# Patient Record
Sex: Female | Born: 1970 | Hispanic: Yes | Marital: Single | Smoking: Never smoker
Health system: Southern US, Community
[De-identification: ages and names within clinical notes are randomized; demographics above are authoritative.]

## PROBLEM LIST (undated history)

## (undated) DIAGNOSIS — E079 Disorder of thyroid, unspecified: Secondary | ICD-10-CM

## (undated) HISTORY — PX: TUBAL LIGATION: SHX77

---

## 2019-04-30 ENCOUNTER — Other Ambulatory Visit: Payer: Self-pay | Admitting: Internal Medicine

## 2019-04-30 DIAGNOSIS — Z20822 Contact with and (suspected) exposure to covid-19: Secondary | ICD-10-CM

## 2019-05-03 LAB — NOVEL CORONAVIRUS, NAA: SARS-CoV-2, NAA: DETECTED — AB

## 2019-05-10 ENCOUNTER — Other Ambulatory Visit: Payer: Self-pay

## 2019-05-10 ENCOUNTER — Encounter: Payer: Self-pay | Admitting: Emergency Medicine

## 2019-05-10 ENCOUNTER — Emergency Department: Payer: HRSA Program

## 2019-05-10 ENCOUNTER — Emergency Department
Admission: EM | Admit: 2019-05-10 | Discharge: 2019-05-10 | Disposition: A | Discharge status: Home / Self Care | Payer: HRSA Program | Attending: Emergency Medicine | Admitting: Emergency Medicine

## 2019-05-10 DIAGNOSIS — K5730 Diverticulosis of large intestine without perforation or abscess without bleeding: Secondary | ICD-10-CM | POA: Diagnosis not present

## 2019-05-10 DIAGNOSIS — U071 COVID-19: Secondary | ICD-10-CM | POA: Diagnosis not present

## 2019-05-10 DIAGNOSIS — R109 Unspecified abdominal pain: Secondary | ICD-10-CM | POA: Diagnosis present

## 2019-05-10 DIAGNOSIS — R1011 Right upper quadrant pain: Secondary | ICD-10-CM

## 2019-05-10 DIAGNOSIS — R05 Cough: Secondary | ICD-10-CM | POA: Diagnosis not present

## 2019-05-10 HISTORY — DX: Disorder of thyroid, unspecified: E07.9

## 2019-05-10 LAB — URINALYSIS, COMPLETE (UACMP) WITH MICROSCOPIC
Bilirubin Urine: NEGATIVE
Glucose, UA: NEGATIVE mg/dL
Hgb urine dipstick: NEGATIVE
Ketones, ur: NEGATIVE mg/dL
Nitrite: NEGATIVE
Protein, ur: 30 mg/dL — AB
Specific Gravity, Urine: 1.026 (ref 1.005–1.030)
pH: 6 (ref 5.0–8.0)

## 2019-05-10 LAB — CBC
HCT: 44.8 % (ref 36.0–46.0)
Hemoglobin: 14.7 g/dL (ref 12.0–15.0)
MCH: 30.4 pg (ref 26.0–34.0)
MCHC: 32.8 g/dL (ref 30.0–36.0)
MCV: 92.8 fL (ref 80.0–100.0)
Platelets: 415 10*3/uL — ABNORMAL HIGH (ref 150–400)
RBC: 4.83 MIL/uL (ref 3.87–5.11)
RDW: 13 % (ref 11.5–15.5)
WBC: 4.3 10*3/uL (ref 4.0–10.5)
nRBC: 0 % (ref 0.0–0.2)

## 2019-05-10 LAB — COMPREHENSIVE METABOLIC PANEL
ALT: 19 U/L (ref 0–44)
AST: 32 U/L (ref 15–41)
Albumin: 3.5 g/dL (ref 3.5–5.0)
Alkaline Phosphatase: 79 U/L (ref 38–126)
Anion gap: 9 (ref 5–15)
BUN: 18 mg/dL (ref 6–20)
CO2: 22 mmol/L (ref 22–32)
Calcium: 8.3 mg/dL — ABNORMAL LOW (ref 8.9–10.3)
Chloride: 107 mmol/L (ref 98–111)
Creatinine, Ser: 0.55 mg/dL (ref 0.44–1.00)
GFR calc Af Amer: >60 mL/min (ref >60)
GFR calc non Af Amer: >60 mL/min (ref >60)
Glucose, Bld: 102 mg/dL — ABNORMAL HIGH (ref 70–99)
Potassium: 4 mmol/L (ref 3.5–5.1)
Sodium: 138 mmol/L (ref 135–145)
Total Bilirubin: 0.6 mg/dL (ref 0.3–1.2)
Total Protein: 7.9 g/dL (ref 6.5–8.1)

## 2019-05-10 LAB — POCT PREGNANCY, URINE: Preg Test, Ur: NEGATIVE

## 2019-05-10 LAB — LIPASE, BLOOD: Lipase: 30 U/L (ref 11–51)

## 2019-05-10 MED ORDER — IOHEXOL 300 MG/ML  SOLN
100.0000 mL | Freq: Once | INTRAMUSCULAR | Status: AC | PRN
  Administered 2019-05-10: 100 mL via INTRAVENOUS
  Filled 2019-05-10: qty 100

## 2019-05-10 MED ORDER — ONDANSETRON 4 MG PO TBDP: 4.0000 mg | ORAL_TABLET | Freq: Three times a day (TID) | ORAL | 0 refills | Status: AC | PRN

## 2019-05-10 MED ORDER — SODIUM CHLORIDE 0.9% FLUSH: 3.0000 mL | Freq: Once | INTRAVENOUS | Status: DC

## 2019-05-10 MED ORDER — AZITHROMYCIN 500 MG PO TABS: 500.0000 mg | ORAL_TABLET | Freq: Every day | ORAL | 0 refills | Status: AC

## 2019-05-10 MED ORDER — MORPHINE SULFATE (PF) 2 MG/ML IV SOLN
2.0000 mg | Freq: Once | INTRAVENOUS | Status: AC
  Administered 2019-05-10: 13:00:00 2 mg via INTRAVENOUS
  Filled 2019-05-10: qty 1

## 2019-05-10 MED ORDER — ONDANSETRON HCL 4 MG/2ML IJ SOLN
4.0000 mg | Freq: Once | INTRAMUSCULAR | Status: AC
  Administered 2019-05-10: 13:00:00 4 mg via INTRAVENOUS
  Filled 2019-05-10: qty 2

## 2019-05-10 NOTE — ED Triage Notes (Signed)
Upper abdominal pain and unable to eat.  No voimiting.  A little cough.  Started 2 weeks ago.

## 2019-05-10 NOTE — ED Notes (Signed)
AAOx3.  Skin warm and dry.  NAD 

## 2019-05-10 NOTE — ED Provider Notes (Signed)
[facility] : [facility] Emergency Department Provider Note _____   First MD Initiated Contact with Patient 05/10/19 1214     (approximate)  I have reviewed the triage vital signs and the nursing notes.   HISTORY  Chief Complaint Abdominal Pain and Cough  HPI Hannah Crane is a 48 y.o. female presents to the emergency department with a 2-week history of intermittent upper abdominal discomfort with associated nausea and inability to tolerate p.o. patient states that symptoms been occurring for the past 2 weeks.  Patient denies any diarrhea.  Patient denies any urinary symptoms.  Patient states that she has had no recent illness however chart review revealed that the patient was tested positive for COVID-19 04/30/2019.       Past Medical History:  Diagnosis Date  . Thyroid disease     There are no active problems to display for this patient.   Past Surgical History:  Procedure Laterality Date  . CESAREAN SECTION      Prior to Admission medications   Medication Sig Start Date End Date Taking? Authorizing Provider  azithromycin (ZITHROMAX) 500 MG tablet Take 1 tablet (500 mg total) by mouth daily for 3 days. Take 1 tablet daily for 3 days. 05/10/19 05/13/19  Gregor Hams, MD  ondansetron (ZOFRAN ODT) 4 MG disintegrating tablet Take 1 tablet (4 mg total) by mouth every 8 (eight) hours as needed. 05/10/19   Gregor Hams, MD    Allergies Patient has no known allergies.  No family history on file.  Social History Social History   Tobacco Use  . Smoking status: Never Smoker  . Smokeless tobacco: Never Used  Substance Use Topics  . Alcohol use: Never    Frequency: Never  . Drug use: Not on file    Review of Systems Constitutional: No fever/chills Eyes: No visual changes. ENT: No sore throat. Cardiovascular: Denies chest pain. Respiratory: Positive for cough Gastrointestinal: Positive for abdominal pain and nausea no vomiting.  No diarrhea.   No constipation. Genitourinary: Negative for dysuria. Musculoskeletal: Negative for neck pain.  Negative for back pain. Integumentary: Negative for rash. Neurological: Negative for headaches, focal weakness or numbness.  ____________________________________________   PHYSICAL EXAM:  VITAL SIGNS: ED Triage Vitals [05/10/19 1144]  Enc Vitals Group     BP 113/71     Pulse Rate 65     Resp 16     Temp 99.3 F (37.4 C)     Temp Source Oral     SpO2 95 %     Weight      Height 1.372 m (4\' 6" )     Head Circumference      Peak Flow      Pain Score 9     Pain Loc      Pain Edu?      Excl. in [place]?     Constitutional: Alert and oriented.  Apparent discomfort Eyes: Conjunctivae are normal.  Mouth/Throat: Mucous membranes are moist. Oropharynx non-erythematous. Neck: No stridor.  Cardiovascular: Normal rate, regular rhythm. Good peripheral circulation. Grossly normal heart sounds. Respiratory: Normal respiratory effort.  No retractions. No audible wheezing. Gastrointestinal: Generalized tenderness to palpation.. No distention.  Musculoskeletal: No lower extremity tenderness nor edema. No gross deformities of extremities. Neurologic:  Normal speech and language. No gross focal neurologic deficits are appreciated.  Skin:  Skin is warm, dry and intact. No rash noted. Psychiatric: Mood and affect are normal. Speech and behavior are normal.  ____________________________________________   LABS (all  labs ordered are listed, but only abnormal results are displayed)  Labs Reviewed  COMPREHENSIVE METABOLIC PANEL - Abnormal; Notable for the following components:      Result Value   Glucose, Bld 102 (*)    Calcium 8.3 (*)    All other components within normal limits  CBC - Abnormal; Notable for the following components:   Platelets 415 (*)    All other components within normal limits  URINALYSIS, COMPLETE (UACMP) WITH MICROSCOPIC - Abnormal; Notable for the following components:    Color, Urine AMBER (*)    APPearance CLOUDY (*)    Protein, ur 30 (*)    Leukocytes,Ua SMALL (*)    Bacteria, UA RARE (*)    All other components within normal limits  LIPASE, BLOOD  POC URINE PREG, ED  POCT PREGNANCY, URINE   ____________________________________________  EKG  ED ECG REPORT I, [place] Waldine Zenz, the attending physician, personally viewed and interpreted this ECG.   Date: 05/10/2019  EKG Time: 12:00 PM  Rate: 61  Rhythm: Normal sinus rhythm  Axis: Normal  Intervals: Normal  ST&T Change: None  ____________________________________________  RADIOLOGY I, [place] Loreen Bankson, personally viewed and evaluated these images (plain radiographs) as part of my medical decision making, as well as reviewing the written report by the radiologist.  ED MD interpretation:    Official radiology report(s): Ct Abdomen Pelvis W Contrast  Result Date: 05/10/2019 CLINICAL DATA:  Upper abdominal pain. Anorexia. Cough. SARS-CoV-2 positive. EXAM: CT ABDOMEN AND PELVIS WITH CONTRAST TECHNIQUE: Multidetector CT imaging of the abdomen and pelvis was performed using the standard protocol following bolus administration of intravenous contrast. CONTRAST:  100mL OMNIPAQUE IOHEXOL 300 MG/ML  SOLN COMPARISON:  None. FINDINGS: Lower chest: Patchy nodular consolidation and ground-glass opacity throughout both lung bases. Hepatobiliary: Normal liver size. No liver mass. Normal gallbladder with no radiopaque cholelithiasis. No biliary ductal dilatation. Pancreas: Normal, with no mass or duct dilation. Spleen: Normal size. No mass. Adrenals/Urinary Tract: Normal adrenals. Simple partially exophytic 1.2 cm renal cyst in lower right kidney. Otherwise normal kidneys, with no hydronephrosis. Normal bladder. Stomach/Bowel: Normal non-distended stomach. Normal caliber small bowel with no small bowel wall thickening. Normal appendix. Moderate sigmoid diverticulosis, with no large bowel wall thickening or significant  pericolonic fat stranding. Vascular/Lymphatic: Normal caliber abdominal aorta. Patent portal, splenic, hepatic and renal veins. No pathologically enlarged lymph nodes in the abdomen or pelvis. Reproductive: Anteverted uterus with multiple simple appearing nabothian cysts in the cervix. Simple 3.1 cm right adnexal cyst (series 2/image 63). No left adnexal lesions. Other: No pneumoperitoneum, ascites or focal fluid collection. Musculoskeletal: No aggressive appearing focal osseous lesions. Moderate lower lumbar spondylosis. IMPRESSION: 1. Patchy nodular consolidation and ground-glass opacity throughout both lung bases, compatible with known COVID-19 pneumonia. 2. No acute abnormality in the abdomen or pelvis. Moderate sigmoid diverticulosis, with no evidence of acute diverticulitis. 3. Simple 3.1 cm right adnexal cyst. No follow-up recommended. This recommendation follows ACR consensus guidelines: White Paper of the ACR Incidental Findings Committee II on Adnexal Findings. J Am Coll Radiol [id number]. Electronically Signed   By: Delbert PhenixJason A Poff M.D.   On: 05/10/2019 14:34   [place] Abdomen Limited Ruq  Result Date: 05/10/2019 CLINICAL DATA:  Abdominal pain. EXAM: ULTRASOUND ABDOMEN LIMITED RIGHT UPPER QUADRANT COMPARISON:  No recent prior. FINDINGS: Gallbladder: No gallstones or wall thickening visualized. No sonographic Murphy sign noted by sonographer. Common bile duct: Diameter: 3.1 mm Liver: No focal lesion identified. Within normal limits in parenchymal echogenicity. Portal vein is  patent on color Doppler imaging with normal direction of blood flow towards the liver. Other: None. IMPRESSION: No acute or focal abnormality.  No gallstones or biliary distention. Electronically Signed   By: Maisie Fushomas  Register   On: 05/10/2019 13:41      Procedures   ____________________________________________   INITIAL IMPRESSION / MDM / ASSESSMENT AND PLAN / ED COURSE  As part of my medical decision making, I reviewed  the following data within the electronic MEDICAL RECORD NUMBER   48 year old female presented with above-stated history and physical exam secondary to generalized abdominal pain.  Considered possibly of cholelithiasis cholecystitis diverticulitis appendicitis as such ultrasound was performed revealed no acute abnormality subsequently CT which was likewise negative with the exception of diverticulosis without evidence of diverticulitis.  As such I suspect the patient symptoms to be secondary to COVID-19 infection.  Stressed the importance to the patient regarding home quarantine.  Also informed the patient of warning signs that would warrant immediate return to the emergency department.     ____________________________________________  FINAL CLINICAL IMPRESSION(S) / ED DIAGNOSES  Final diagnoses:  RUQ pain  COVID-19 virus infection  Diverticulosis of colon     MEDICATIONS GIVEN DURING THIS VISIT:  Medications  morphine 2 MG/ML injection 2 mg (2 mg Intravenous Given 05/10/19 1250)  ondansetron (ZOFRAN) injection 4 mg (4 mg Intravenous Given 05/10/19 1250)  iohexol (OMNIPAQUE) 300 MG/ML solution 100 mL (100 mLs Intravenous Contrast Given 05/10/19 1413)     ED Discharge Orders         Ordered    ondansetron (ZOFRAN ODT) 4 MG disintegrating tablet  Every 8 hours PRN     05/10/19 1449    azithromycin (ZITHROMAX) 500 MG tablet  Daily     05/10/19 1449          *Please note:  Hannah Crane was evaluated in Emergency Department on 05/11/2019 for the symptoms described in the history of present illness. She was evaluated in the context of the global COVID-19 pandemic, which necessitated consideration that the patient might be at risk for infection with the SARS-CoV-2 virus that causes COVID-19. Institutional protocols and algorithms that pertain to the evaluation of patients at risk for COVID-19 are in a state of rapid change based on information released by regulatory bodies including the CDC  and federal and state organizations. These policies and algorithms were followed during the patient's care in the ED.  Some ED evaluations and interventions may be delayed as a result of limited staffing during the pandemic.*  Note:  This document was prepared using Dragon voice recognition software and may include unintentional dictation errors.   Darci CurrentBrown, [place] N, MD 05/11/19 1051

## 2019-05-10 NOTE — ED Triage Notes (Signed)
sakys upper mid abdominal pain

## 2020-02-24 ENCOUNTER — Ambulatory Visit: Payer: Self-pay

## 2020-02-24 DIAGNOSIS — Z23 Encounter for immunization: Secondary | ICD-10-CM

## 2020-03-16 ENCOUNTER — Ambulatory Visit: Payer: Self-pay | Attending: Internal Medicine

## 2020-03-16 DIAGNOSIS — Z23 Encounter for immunization: Secondary | ICD-10-CM

## 2020-04-15 IMAGING — CT CT ABDOMEN AND PELVIS WITH CONTRAST
2 of 5 series · 16 of 46 positions shown, 18 images · IV contrast (APPLIED)
Comparison: None.

CLINICAL DATA: Upper abdominal pain. Anorexia. Cough. [id number]-[id number]
positive.

EXAM:
CT ABDOMEN AND PELVIS WITH CONTRAST
TECHNIQUE: Multidetector CT imaging of the abdomen and pelvis was performed
using the standard protocol following bolus administration of
intravenous contrast.
CONTRAST:  100mL OMNIPAQUE IOHEXOL 300 MG/ML  SOLN

[Series 2: routine abd/pel with · axial · 0.74mm/px · z∈[-486,-56]mm · 13 of 96 slices shown, 15 images]
[im 5/96  soft-tissue]
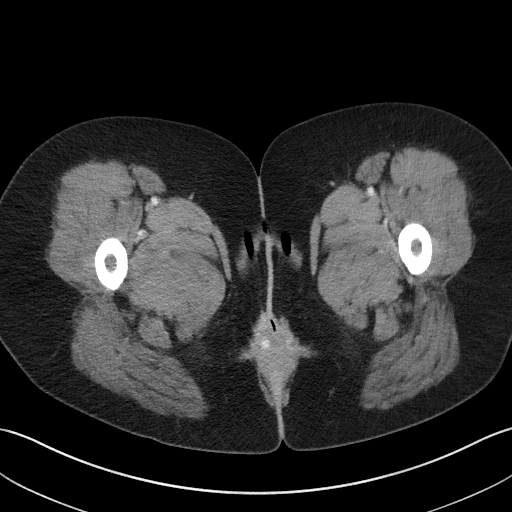
[im 5/96  bone]
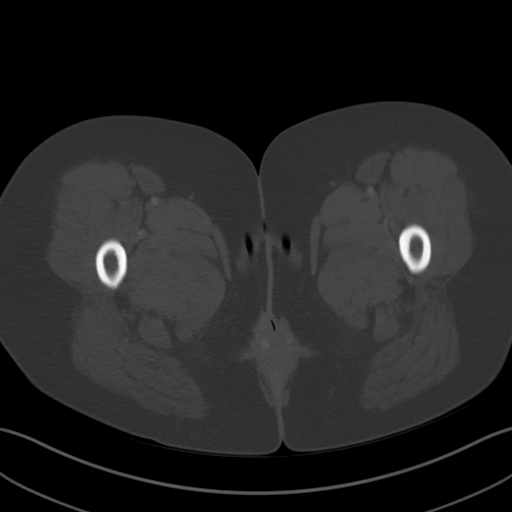
[im 15/96  soft-tissue]
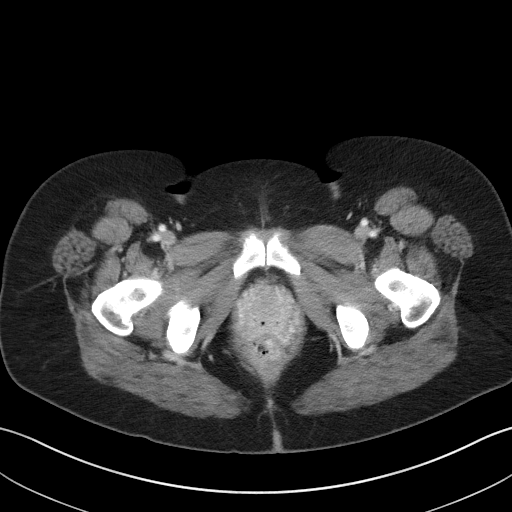
[im 20/96  soft-tissue]
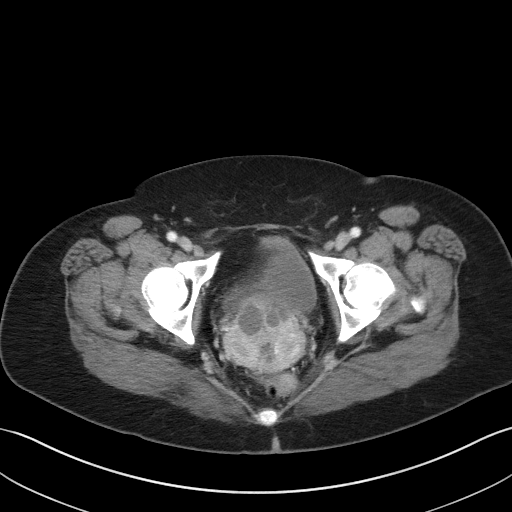
[im 29/96  soft-tissue]
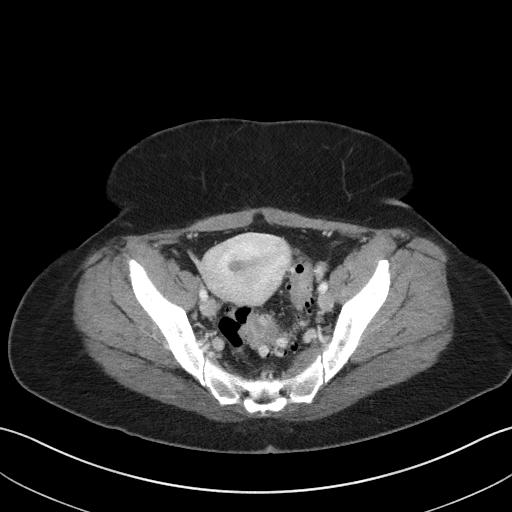
[im 34/96  soft-tissue]
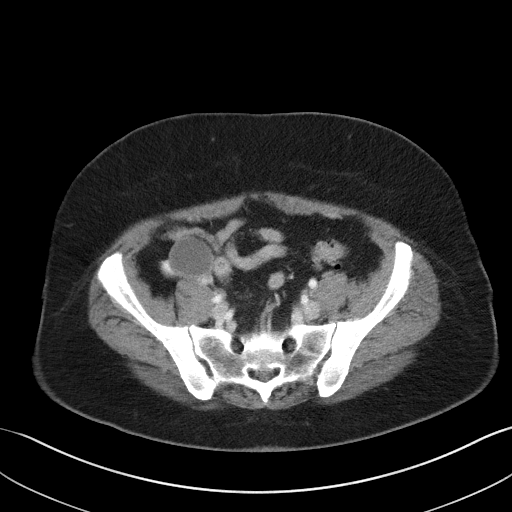
[im 43/96  soft-tissue]
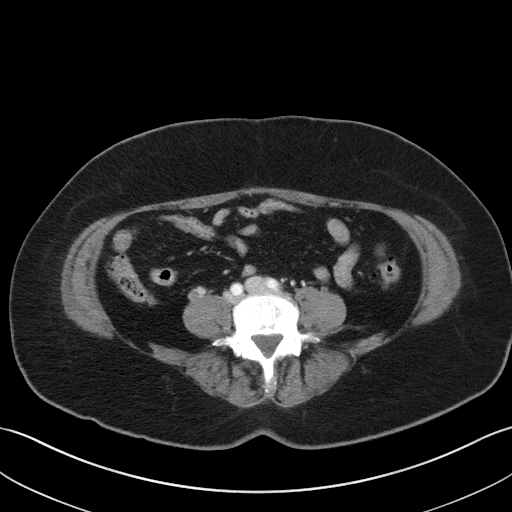
[im 48/96  soft-tissue]
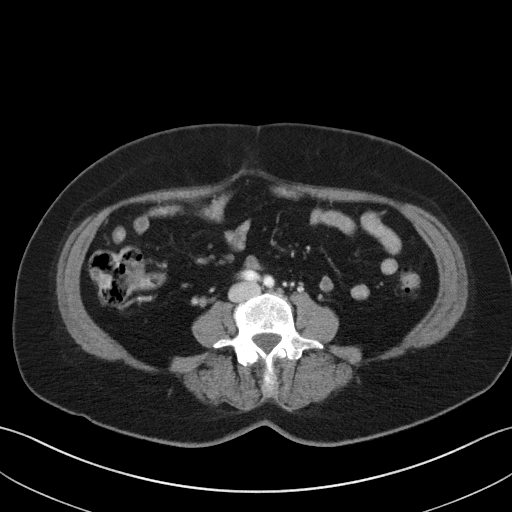
[im 53/96  soft-tissue]
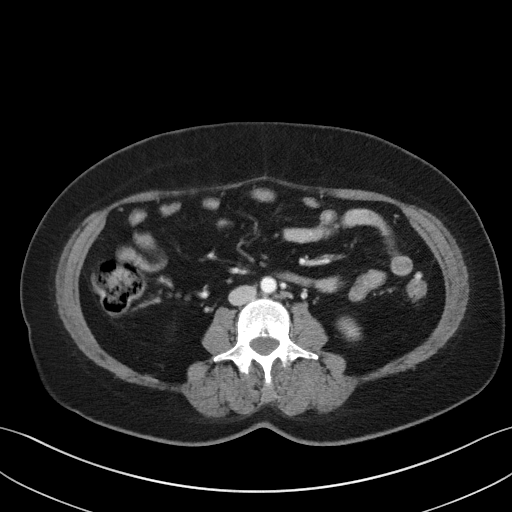
[im 62/96  soft-tissue]
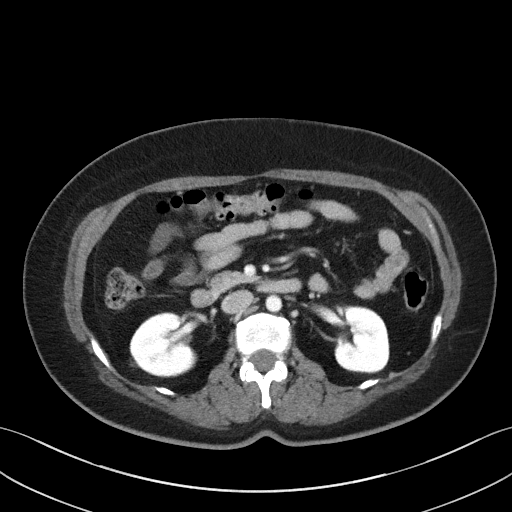
[im 62/96  bone]
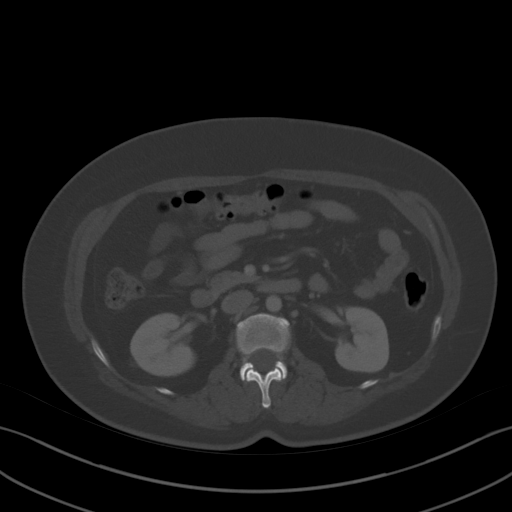
[im 67/96  soft-tissue]
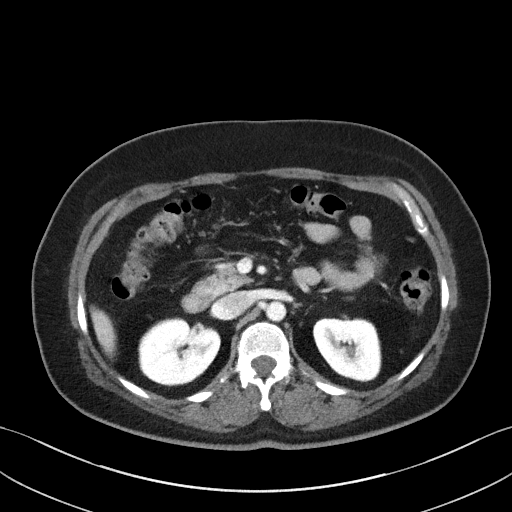
[im 77/96  soft-tissue]
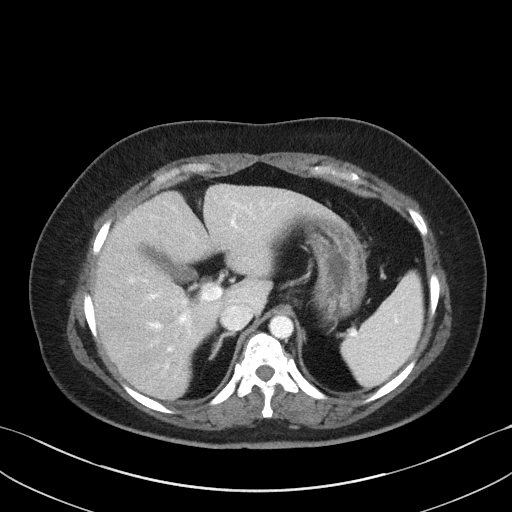
[im 81/96  soft-tissue]
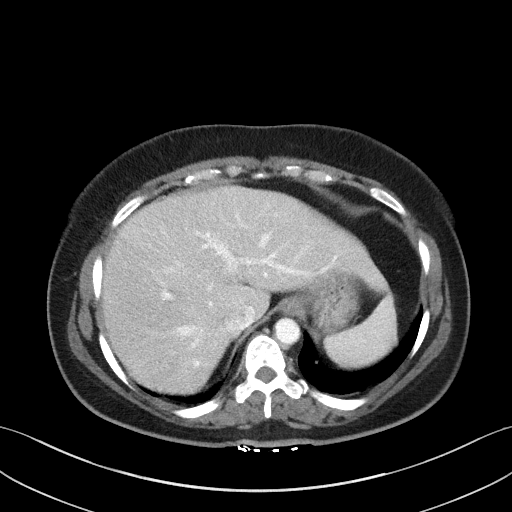
[im 91/96  soft-tissue]
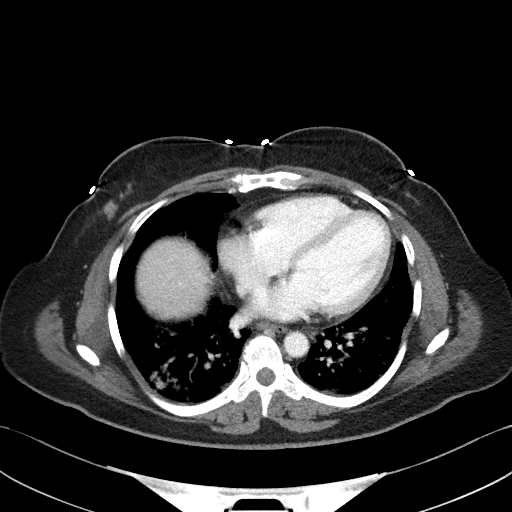

[Series 6: coronal st · coronal · 0.82mm/px · 3 of 86 slices shown]
[im 29/86  soft-tissue]
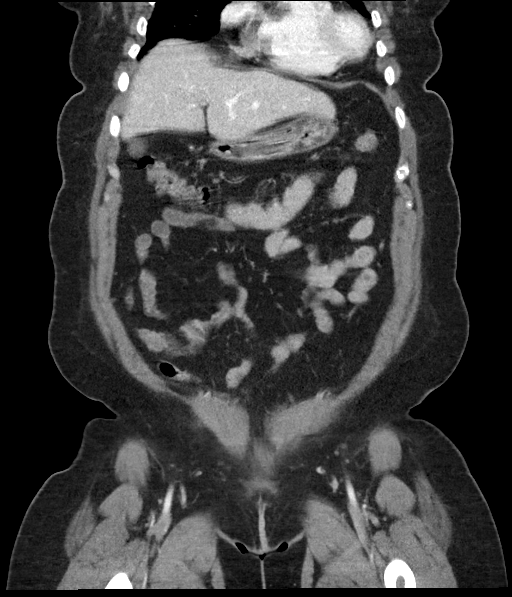
[im 38/86  soft-tissue]
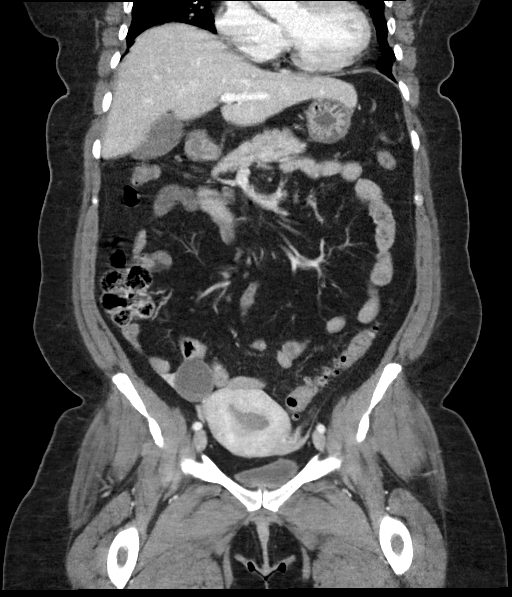
[im 48/86  soft-tissue]
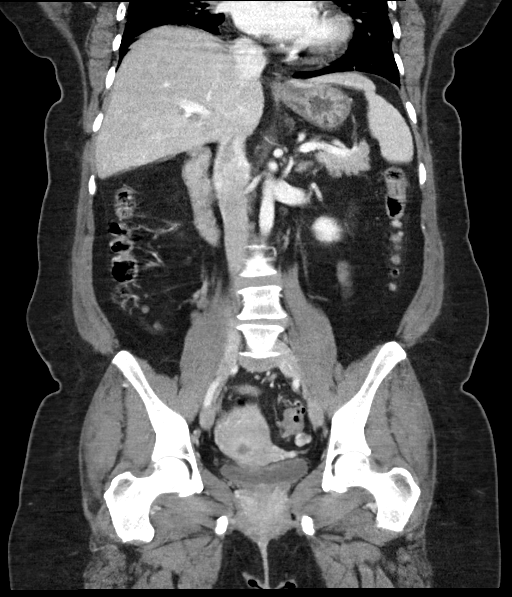

[16 of 46 positions shown; findings below may reference images not displayed]

FINDINGS: Lower chest: Patchy nodular consolidation and ground-glass opacity
throughout both lung bases.

Hepatobiliary: Normal liver size. No liver mass. Normal gallbladder
with no radiopaque cholelithiasis. No biliary ductal dilatation.

Pancreas: Normal, with no mass or duct dilation.

Spleen: Normal size. No mass.

Adrenals/Urinary Tract: Normal adrenals. Simple partially exophytic
1.2 cm renal cyst in lower right kidney. Otherwise normal kidneys,
with no hydronephrosis. Normal bladder.

Stomach/Bowel: Normal non-distended stomach. Normal caliber small
bowel with no small bowel wall thickening. Normal appendix. Moderate
sigmoid diverticulosis, with no large bowel wall thickening or
significant pericolonic fat stranding.

Vascular/Lymphatic: Normal caliber abdominal aorta. Patent portal,
splenic, hepatic and renal veins. No pathologically enlarged lymph
nodes in the abdomen or pelvis.

Reproductive: Anteverted uterus with multiple simple appearing
nabothian cysts in the cervix. Simple 3.1 cm right adnexal cyst
(series 2/image 63). No left adnexal lesions.

Other: No pneumoperitoneum, ascites or focal fluid collection.

Musculoskeletal: No aggressive appearing focal osseous lesions.
Moderate lower lumbar spondylosis.
IMPRESSION: 1. Patchy nodular consolidation and ground-glass opacity throughout
both lung bases, compatible with known 510PR-J1 pneumonia.
2. No acute abnormality in the abdomen or pelvis. Moderate sigmoid
diverticulosis, with no evidence of acute diverticulitis.
3. Simple 3.1 cm right adnexal cyst. No follow-up recommended. This
recommendation follows ACR consensus guidelines: White Paper of the
ACR Incidental Findings Committee II on Adnexal Findings. [HOSPITAL] [DATE].

## 2022-08-23 ENCOUNTER — Encounter: Payer: Self-pay | Admitting: Pediatric Orthopedic Surgery

## 2022-09-16 ENCOUNTER — Other Ambulatory Visit: Payer: Self-pay | Admitting: *Deleted

## 2022-09-16 ENCOUNTER — Inpatient Hospital Stay
Admission: RE | Admit: 2022-09-16 | Discharge: 2022-09-16 | Disposition: A | Discharge status: Home / Self Care | Payer: Self-pay | Source: Ambulatory Visit | Attending: *Deleted | Admitting: *Deleted

## 2022-09-16 DIAGNOSIS — Z1231 Encounter for screening mammogram for malignant neoplasm of breast: Secondary | ICD-10-CM

## 2022-10-18 ENCOUNTER — Other Ambulatory Visit: Payer: Self-pay

## 2022-10-18 DIAGNOSIS — Z1231 Encounter for screening mammogram for malignant neoplasm of breast: Secondary | ICD-10-CM

## 2022-11-12 ENCOUNTER — Ambulatory Visit
Admission: RE | Admit: 2022-11-12 | Discharge: 2022-11-12 | Disposition: A | Discharge status: Home / Self Care | Payer: Self-pay | Source: Ambulatory Visit | Attending: Obstetrics and Gynecology | Admitting: Obstetrics and Gynecology

## 2022-11-12 ENCOUNTER — Ambulatory Visit: Payer: Self-pay | Attending: Hematology and Oncology | Admitting: Hematology and Oncology

## 2022-11-12 VITALS — BP 126/71 | Wt 166.5 lb

## 2022-11-12 DIAGNOSIS — Z1231 Encounter for screening mammogram for malignant neoplasm of breast: Secondary | ICD-10-CM

## 2022-11-12 NOTE — Progress Notes (Signed)
Hannah Crane is a 52 y.o. female who presents to [facility] clinic today with no complaints.    Pap Smear: Pap not smear completed today. Last Pap smear was 2023 at [facility] clinic and was normal. Per patient has no history of an abnormal Pap smear. Last Pap smear result is not available in Epic.   Physical exam: Breasts Breasts symmetrical. No skin abnormalities bilateral breasts. No nipple retraction bilateral breasts. No nipple discharge bilateral breasts. No lymphadenopathy. No lumps palpated bilateral breasts.        Pelvic/Bimanual Pap is not indicated today    Smoking History: Patient has never smoked and was not referred to quit line.    Patient Navigation: Patient education provided. Access to services provided for patient through [place] interpreter provided. No transportation provided   Colorectal Cancer Screening: Per patient has never had colonoscopy completed No complaints today. FIT test per Princella Ion   Breast and Cervical Cancer Risk Assessment: Patient does not have family history of breast cancer, known genetic mutations, or radiation treatment to the chest before age 74. Patient does not have history of cervical dysplasia, immunocompromised, or DES exposure in-utero.  Risk Scores as of 11/12/2022     [place]           5-year 1.23 %   Lifetime 10.64 %   This patient is [place] but has no documented birth country, so the [place] used data from [facility] patients to calculate their risk score. Document a birth country in the Demographics activity for a more accurate score.         Last calculated by Demetrius Revel, LPN on 01/11/8031 at  1:22 AM        A: [facility] exam without pap smear No complaints with benign exam.  P: [facility]. Appointment scheduled 11/12/22.  [place] Scrape A, NP 11/12/2022 9:01 AM

## 2022-11-12 NOTE — Patient Instructions (Signed)
[facility] about breast self awareness and gave educational materials to take home. Patient did not need a Pap smear today due to last Pap smear was in 2023 per patient. She will be due in 2028. Let her know [facility] will cover Pap smears every 5 years unless has a history of abnormal Pap smears. Referred patient to the [facility] for diagnostic mammogram. Appointment scheduled for 11/12/22. Patient aware of appointment and will be there. Let patient know will follow up with her within the next couple weeks with results. [place] verbalized understanding.  Melodye Ped, NP 9:03 AM

## 2023-11-19 ENCOUNTER — Other Ambulatory Visit: Payer: Self-pay

## 2023-11-19 ENCOUNTER — Emergency Department
Admission: EM | Admit: 2023-11-19 | Discharge: 2023-11-19 | Disposition: A | Discharge status: Home / Self Care | Payer: Self-pay | Attending: Emergency Medicine | Admitting: Emergency Medicine

## 2023-11-19 DIAGNOSIS — B029 Zoster without complications: Secondary | ICD-10-CM | POA: Insufficient documentation

## 2023-11-19 MED ORDER — OXYCODONE HCL 5 MG PO TABS: 5.0000 mg | ORAL_TABLET | Freq: Three times a day (TID) | ORAL | 0 refills | Status: DC | PRN

## 2023-11-19 MED ORDER — KETOROLAC TROMETHAMINE 60 MG/2ML IM SOLN
30.0000 mg | Freq: Once | INTRAMUSCULAR | Status: AC
  Administered 2023-11-19: 30 mg via INTRAMUSCULAR
  Filled 2023-11-19: qty 2

## 2023-11-19 MED ORDER — OXYCODONE HCL 5 MG PO TABS: 5.0000 mg | ORAL_TABLET | Freq: Three times a day (TID) | ORAL | 0 refills | Status: AC | PRN

## 2023-11-19 NOTE — ED Triage Notes (Signed)
Pt was dx with shingles on her left knee on 1/27, pt reports rash is burning and leg is hurting. Pt has not been taking medication as prescribed.

## 2023-11-19 NOTE — ED Provider Notes (Signed)
[facility] Provider Note    Event Date/Time   First MD Initiated Contact with Patient 11/19/23 0247     (approximate)   History   Rash   HPI Hannah Crane is a 53 y.o. female presenting today for ration pain.  Patient states she had a recent diagnosis of shingles on her left lower leg.  She has had nerve pain at the site since initiation.  She states the rash was improving but pain symptoms are still there.  She was prescribed valacyclovir for 7 days but states only taking the medication for 3 days.  Denies rash or pain symptoms elsewhere.     Physical Exam   Triage Vital Signs: ED Triage Vitals  Encounter Vitals Group     BP 11/19/23 0244 123/76     Systolic BP Percentile --      Diastolic BP Percentile --      Pulse Rate 11/19/23 0244 71     Resp 11/19/23 0244 18     Temp 11/19/23 0244 97.8 F (36.6 C)     Temp Source 11/19/23 0244 Oral     SpO2 11/19/23 0244 99 %     Weight 11/19/23 0244 176 lb (79.8 kg)     Height 11/19/23 0244 4\' 7"  (1.397 m)     Head Circumference --      Peak Flow --      Pain Score 11/19/23 0243 10     Pain Loc --      Pain Education --      Exclude from Growth Chart --     Most recent vital signs: Vitals:   11/19/23 0244  BP: 123/76  Pulse: 71  Resp: 18  Temp: 97.8 F (36.6 C)  SpO2: 99%   I have reviewed the vital signs. General:  Awake, alert, no acute distress. Head:  Normocephalic, Atraumatic. EENT:  PERRL, EOMI, Oral mucosa pink and moist, Neck is supple. Cardiovascular: Regular rate, 2+ distal pulses. Respiratory:  Normal respiratory effort, symmetrical expansion, no distress.   Extremities:  Moving all four extremities through full ROM without pain.   Neuro:  Alert and oriented.  Interacting appropriately.   Skin: Appropriately healing shingles rash in the L2 distribution of the left lower extremity.  No pustules or other fluid drainage Psych: Appropriate affect.    ED Results /  Procedures / Treatments   Labs (all labs ordered are listed, but only abnormal results are displayed) Labs Reviewed - No data to display   EKG    RADIOLOGY    PROCEDURES:  Critical Care performed: No  Procedures   MEDICATIONS ORDERED IN ED: Medications  ketorolac (TORADOL) injection 30 mg (has no administration in time range)     IMPRESSION / MDM / ASSESSMENT AND PLAN / ED COURSE  I reviewed the triage vital signs and the nursing notes.                              Differential diagnosis includes, but is not limited to, shingles, neuropathic pain  Patient's presentation is most consistent with acute, uncomplicated illness.  Patient is a 53 year old female presenting today for neuropathic pain related to recent shingles diagnosis.  Rash does appear to be improving at this time in the L2 distribution on her left lower extremity with no active pustules.  Pain symptoms do seem most consistent with neuropathic pain related to shingles.  Will give Toradol shot  here and discharged with oxycodone as needed.  Recommended use of ibuprofen and Tylenol as well.  She also did not fully complete her valacyclovir treatment and recommended her complete that at this time.  Told to follow-up with PCP and given strict return precautions.     FINAL CLINICAL IMPRESSION(S) / ED DIAGNOSES   Final diagnoses:  Herpes zoster without complication     Rx / DC Orders   ED Discharge Orders          Ordered    oxyCODONE (ROXICODONE) 5 MG immediate release tablet  Every 8 hours PRN        11/19/23 0313             Note:  This document was prepared using Dragon voice recognition software and may include unintentional dictation errors.   Janith Lima, MD 11/19/23 [phone]

## 2023-11-19 NOTE — Discharge Instructions (Signed)
You can take ibuprofen and Tylenol with pain symptoms.  I have sent stronger medication to your pharmacy to only take as needed for severe pain.  You need to complete the treatment with the medication they gave you with a valacyclovir.  Symptoms should slowly resolve with time.  Otherwise follow-up with your primary care provider for ongoing treatment of pain.

## 2023-11-19 NOTE — ED Notes (Signed)
Pt to Ed with daughter with reports of pain. Pt was recently dx with shingles and was using the ointment prescribed but not taking the pills. Pt woke up around 11pm with intense pain to left thigh where rash is present.
